# Patient Record
Sex: Male | Born: 1985 | Race: Black or African American | Hispanic: No | Smoking: Never smoker
Health system: Southern US, Community
[De-identification: ages and names within clinical notes are randomized; demographics above are authoritative.]

## PROBLEM LIST (undated history)

## (undated) DIAGNOSIS — J45909 Unspecified asthma, uncomplicated: Secondary | ICD-10-CM

---

## 2016-05-29 ENCOUNTER — Encounter (HOSPITAL_COMMUNITY): Payer: Self-pay | Admitting: Emergency Medicine

## 2016-05-29 ENCOUNTER — Emergency Department (HOSPITAL_COMMUNITY): Payer: Managed Care, Other (non HMO)

## 2016-05-29 ENCOUNTER — Emergency Department (HOSPITAL_COMMUNITY)
Admission: EM | Admit: 2016-05-29 | Discharge: 2016-05-30 | Disposition: A | Discharge status: Home / Self Care | Payer: Managed Care, Other (non HMO) | Attending: Emergency Medicine | Admitting: Emergency Medicine

## 2016-05-29 DIAGNOSIS — J45909 Unspecified asthma, uncomplicated: Secondary | ICD-10-CM | POA: Diagnosis not present

## 2016-05-29 DIAGNOSIS — I319 Disease of pericardium, unspecified: Secondary | ICD-10-CM | POA: Diagnosis not present

## 2016-05-29 DIAGNOSIS — R079 Chest pain, unspecified: Secondary | ICD-10-CM | POA: Diagnosis present

## 2016-05-29 HISTORY — DX: Unspecified asthma, uncomplicated: J45.909

## 2016-05-29 LAB — I-STAT TROPONIN, ED: TROPONIN I, POC: 0.01 ng/mL (ref 0.00–0.08)

## 2016-05-29 LAB — BASIC METABOLIC PANEL
ANION GAP: 11 (ref 5–15)
BUN: 8 mg/dL (ref 6–20)
CALCIUM: 9.4 mg/dL (ref 8.9–10.3)
CHLORIDE: 102 mmol/L (ref 101–111)
CO2: 25 mmol/L (ref 22–32)
CREATININE: 1.3 mg/dL — AB (ref 0.61–1.24)
GFR calc non Af Amer: >60 mL/min (ref >60)
Glucose, Bld: 99 mg/dL (ref 65–99)
Potassium: 4 mmol/L (ref 3.5–5.1)
SODIUM: 138 mmol/L (ref 135–145)

## 2016-05-29 LAB — CBC
HCT: 44.4 % (ref 39.0–52.0)
HEMOGLOBIN: 15.6 g/dL (ref 13.0–17.0)
MCH: 29.7 pg (ref 26.0–34.0)
MCHC: 35.1 g/dL (ref 30.0–36.0)
MCV: 84.4 fL (ref 78.0–100.0)
PLATELETS: 225 10*3/uL (ref 150–400)
RBC: 5.26 MIL/uL (ref 4.22–5.81)
RDW: 12.7 % (ref 11.5–15.5)
WBC: 11.2 10*3/uL — AB (ref 4.0–10.5)

## 2016-05-29 NOTE — ED Triage Notes (Signed)
Pt. reports intermittent left chest pain radiating to left arm with mild SOB , occasional productive cough , no emesis or diaphoresis .

## 2016-05-30 MED ORDER — COLCHICINE 0.6 MG PO TABS: 0.6000 mg | ORAL_TABLET | Freq: Two times a day (BID) | ORAL | 3 refills | Status: DC

## 2016-05-30 MED ORDER — IBUPROFEN 800 MG PO TABS: 800.0000 mg | ORAL_TABLET | Freq: Three times a day (TID) | ORAL | 0 refills | Status: DC

## 2016-05-30 MED ORDER — OMEPRAZOLE 20 MG PO CPDR: 20.0000 mg | DELAYED_RELEASE_CAPSULE | Freq: Every day | ORAL | 0 refills | Status: DC

## 2016-05-30 NOTE — ED Provider Notes (Signed)
[facility]-EMERGENCY DEPT Provider Note   CSN: [id number] Arrival date & time: 05/29/16  2023     History   Chief Complaint Chief Complaint  Patient presents with  . Chest Pain    HPI James Bowers is a 30 y.o. male.  Patient presents to the emergency department with chief complaint of left-sided chest pain. He states that he has been having symptoms since Tuesday. He states that his symptoms are worse when lying down and better when he sits up. He reports mild shortness of breath when lying down. He reports having a recent URI.  He denies any fevers or chills.  There are no other associated symptoms. He denies any cough or wheezing.   The history is provided by the patient. No language interpreter was used.    Past Medical History:  Diagnosis Date  . Asthma     There are no active problems to display for this patient.   History reviewed. No pertinent surgical history.     Home Medications    Prior to Admission medications   Not on File    Family History No family history on file.  Social History Social History  Substance Use Topics  . Smoking status: Never Smoker  . Smokeless tobacco: Never Used  . Alcohol use Yes     Allergies   Azithromycin   Review of Systems Review of Systems  Respiratory: Positive for shortness of breath. Negative for cough and wheezing.   Cardiovascular: Positive for chest pain.  All other systems reviewed and are negative.    Physical Exam Updated Vital Signs There were no vitals taken for this visit.  Physical Exam  Constitutional: He is oriented to person, place, and time. He appears well-developed and well-nourished.  HENT:  Head: Normocephalic and atraumatic.  Eyes: Conjunctivae and EOM are normal. Pupils are equal, round, and reactive to light. Right eye exhibits no discharge. Left eye exhibits no discharge. No scleral icterus.  Neck: Normal range of motion. Neck supple. No JVD present.  Cardiovascular: Normal rate,  regular rhythm and normal heart sounds.  Exam reveals no gallop and no friction rub.   No murmur heard. Pulmonary/Chest: Effort normal and breath sounds normal. No respiratory distress. He has no wheezes. He has no rales. He exhibits no tenderness.  Abdominal: Soft. He exhibits no distension and no mass. There is no tenderness. There is no rebound and no guarding.  Musculoskeletal: Normal range of motion. He exhibits no edema or tenderness.  Neurological: He is alert and oriented to person, place, and time.  Skin: Skin is warm and dry.  Psychiatric: He has a normal mood and affect. His behavior is normal. Judgment and thought content normal.  Nursing note and vitals reviewed.    ED Treatments / Results  Labs (all labs ordered are listed, but only abnormal results are displayed) Labs Reviewed  BASIC METABOLIC PANEL - Abnormal; Notable for the following:       Result Value   Creatinine, Ser 1.30 (*)    All other components within normal limits  CBC - Abnormal; Notable for the following:    WBC 11.2 (*)    All other components within normal limits  I-STAT TROPOININ, ED    EKG  EKG Interpretation  Date/Time:  Friday May 30 2016 00:37:15 EDT Ventricular Rate:  70 PR Interval:  144 QRS Duration: 82 QT Interval:  364 QTC Calculation: 393 R Axis:   27 Text Interpretation:  Sinus rhythm ST-t wave abnormality No significant change  since last tracing Abnormal ekg Confirmed by Gerhard MunchLOCKWOOD, Torrie Namba  MD [phone]) on 05/30/2016 12:43:32 AM       Radiology Dg Chest 2 View  Result Date: 05/29/2016 CLINICAL DATA:  Chest pain and shortness of breath for days. EXAM: CHEST  2 VIEW COMPARISON:  None. FINDINGS: The cardiomediastinal contours are normal. The lungs are clear. Pulmonary vasculature is normal. No consolidation, pleural effusion, or pneumothorax. No acute osseous abnormalities are seen. IMPRESSION: No active cardiopulmonary disease. Electronically Signed   By: Rubye OaksMelanie  Ehinger M.D.    On: 05/29/2016 21:28    Procedures Procedures (including critical care time)  Medications Ordered in ED Medications - No data to display   Initial Impression / Assessment and Plan / ED Course  I have reviewed the triage vital signs and the nursing notes.  Pertinent labs & imaging results that were available during my care of the patient were reviewed by me and considered in my medical decision making (see chart for details).  Clinical Course    Patient with left-sided chest pain since Tuesday.  He states that he has increased pain and some shortness of breath when he lies down, this is improved when he sits up. He reports having a recent URI a couple weeks ago. He denies any fevers or chills. Denies any cough. He states the symptoms have been improving from yesterday.  EKG shows some nonspecific ST changes, this could be significant for pericarditis given the patient's symptoms. I will place patient on anti-inflammatory, colchicine, and will give omeprazole for GI prophylaxis. And close follow-up with primary care doctor.  Troponin is negative. I doubt ACS or myocarditis. Patient is very well-appearing.  Patient discussed with Dr. Bebe ShaggyWickline, who agrees with the plan.  Final Clinical Impressions(s) / ED Diagnoses   Final diagnoses:  Pericarditis, unspecified chronicity, unspecified type    New Prescriptions New Prescriptions   COLCHICINE 0.6 MG TABLET    Take 1 tablet (0.6 mg total) by mouth 2 (two) times daily.   IBUPROFEN (ADVIL,MOTRIN) 800 MG TABLET    Take 1 tablet (800 mg total) by mouth 3 (three) times daily.   OMEPRAZOLE (PRILOSEC) 20 MG CAPSULE    Take 1 capsule (20 mg total) by mouth daily.     Roxy Horsemanobert Marilene Vath, PA-C 05/30/16 0125    Zadie Rhineonald Wickline, MD 05/30/16 [phone]

## 2017-05-06 IMAGING — DX DG CHEST 2V
2 series · 2 of 2 positions shown · non-contrast
Comparison: None.

CLINICAL DATA: Chest pain and shortness of breath for days.

EXAM:
CHEST  2 VIEW

[w chest pa]
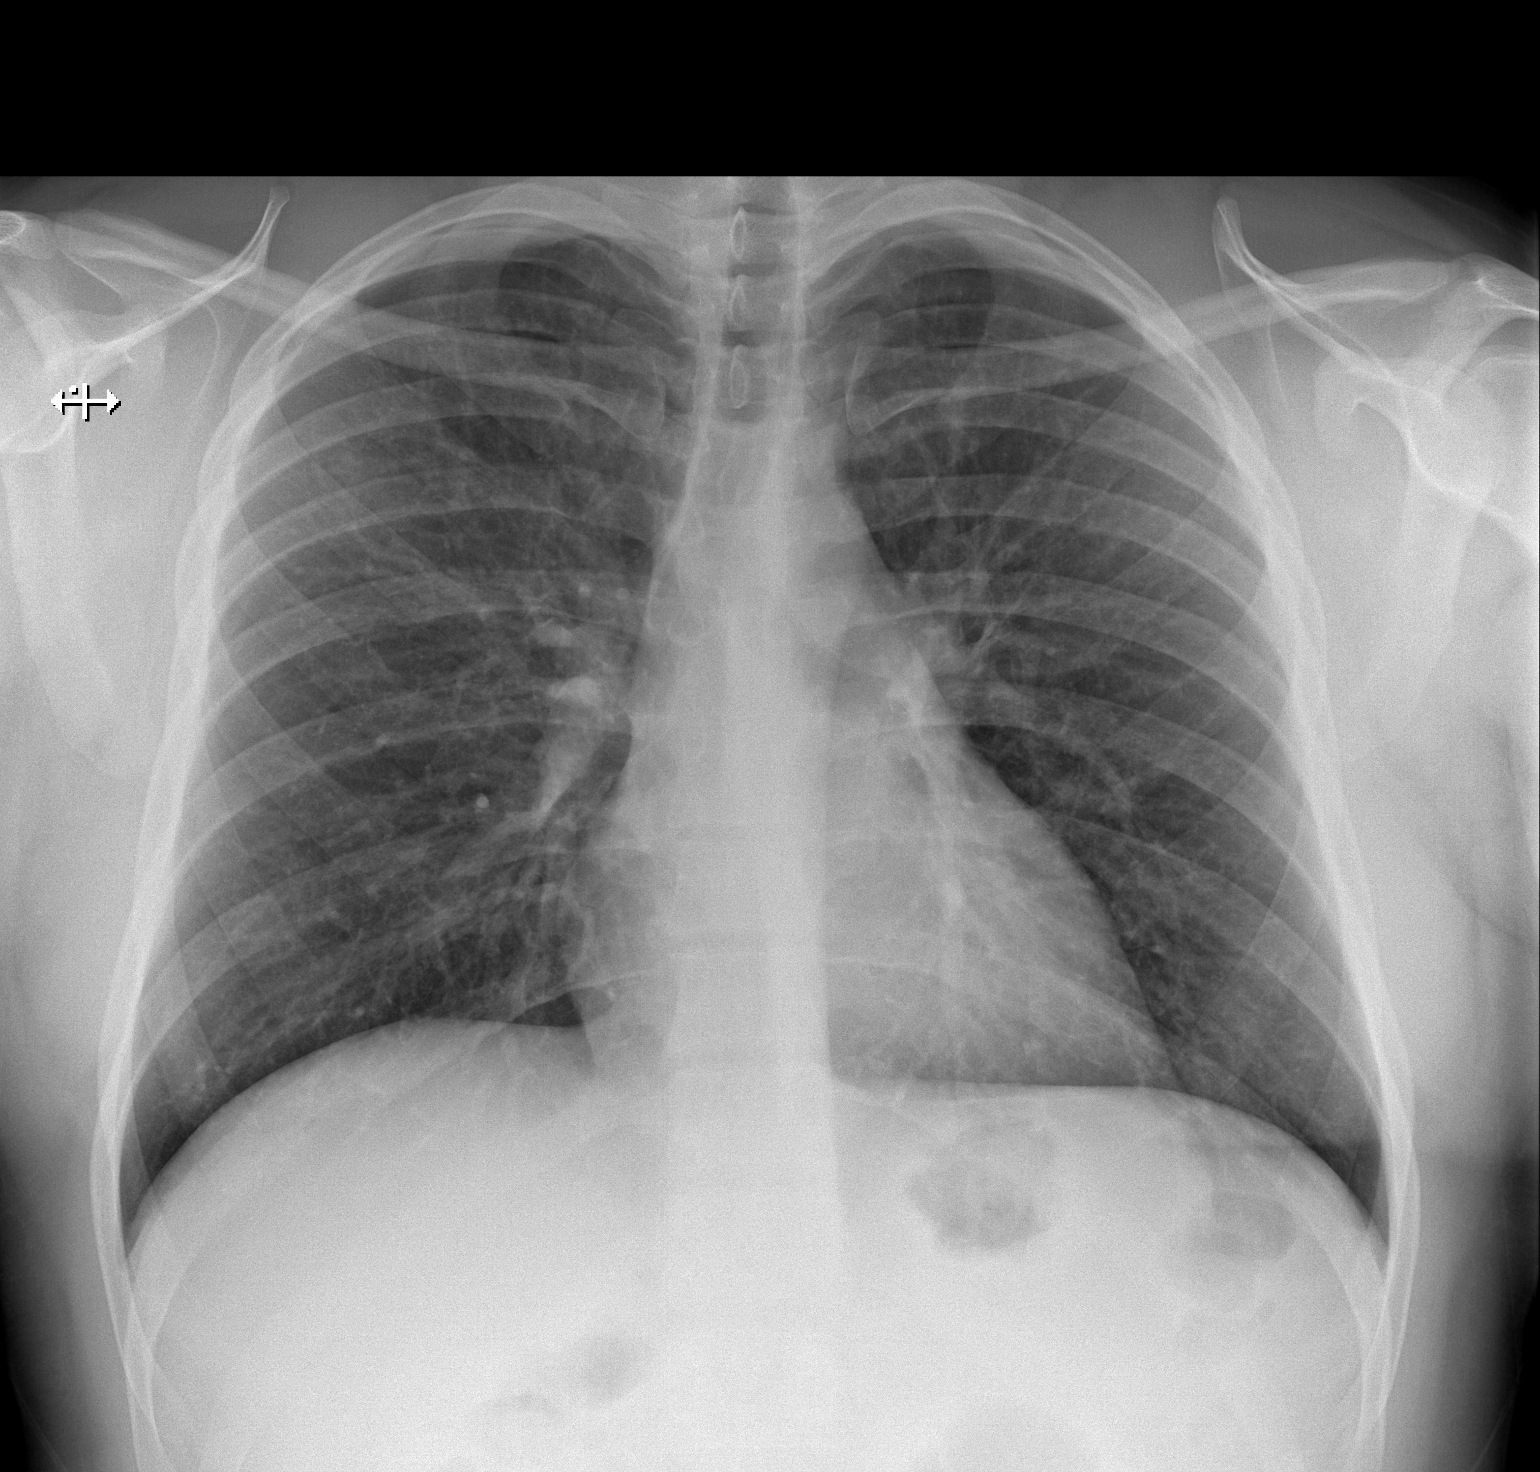

[w chest lat]
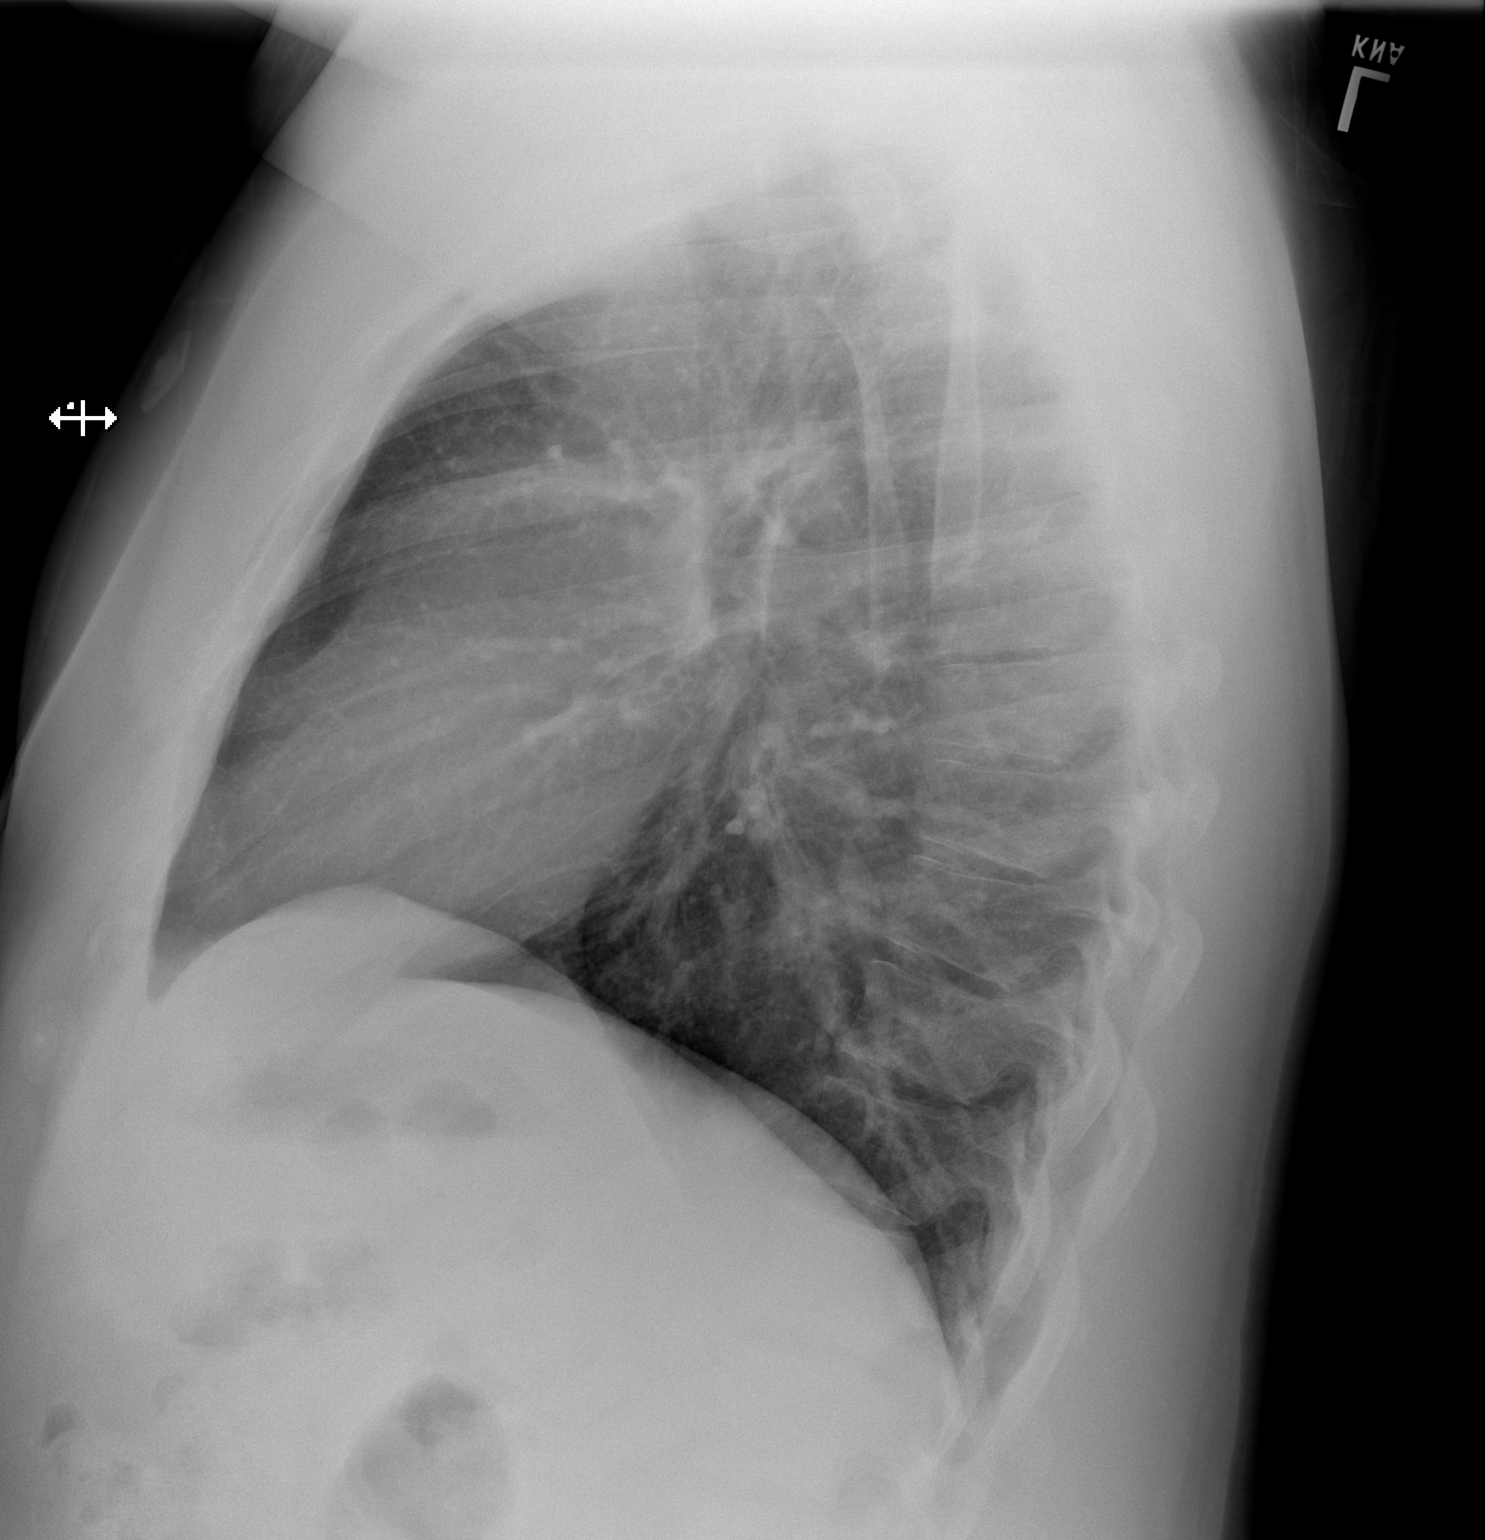

[2 of 2 positions shown; findings below may reference images not displayed]

FINDINGS: The cardiomediastinal contours are normal. The lungs are clear.
Pulmonary vasculature is normal. No consolidation, pleural effusion,
or pneumothorax. No acute osseous abnormalities are seen.
IMPRESSION: No active cardiopulmonary disease.

## 2017-06-01 ENCOUNTER — Encounter: Payer: Self-pay | Admitting: "Endocrinology

## 2017-06-01 ENCOUNTER — Ambulatory Visit (INDEPENDENT_AMBULATORY_CARE_PROVIDER_SITE_OTHER): Payer: Managed Care, Other (non HMO) | Admitting: "Endocrinology

## 2017-06-01 VITALS — BP 136/84 | HR 84 | Ht 74.0 in | Wt 276.0 lb

## 2017-06-01 DIAGNOSIS — E212 Other hyperparathyroidism: Secondary | ICD-10-CM | POA: Diagnosis not present

## 2017-06-01 MED ORDER — VITAMIN D (ERGOCALCIFEROL) 1.25 MG (50000 UNIT) PO CAPS: 50000.0000 [IU] | ORAL_CAPSULE | ORAL | 0 refills | Status: AC

## 2017-06-01 NOTE — Progress Notes (Signed)
Subjective:    Patient ID: James Bowers, male    DOB: 20-Jan-1986, PCP Jaff, Jenean Lindau, MD   Past Medical History:  Diagnosis Date  . Asthma    History reviewed. No pertinent surgical history. Social History   Social History  . Marital status: Married    Spouse name: N/A  . Number of children: N/A  . Years of education: N/A   Social History Main Topics  . Smoking status: Never Smoker  . Smokeless tobacco: Never Used  . Alcohol use Yes  . Drug use: No  . Sexual activity: Not Asked   Other Topics Concern  . None   Social History Narrative  . None   Outpatient Encounter Prescriptions as of 06/01/2017  Medication Sig  . albuterol (PROVENTIL HFA;VENTOLIN HFA) 108 (90 Base) MCG/ACT inhaler Inhale into the lungs every 6 (six) hours as needed.  . Cholecalciferol (VITAMIN D3) 5000 units TABS Take by mouth daily.  Marland Kitchen omalizumab (XOLAIR) 150 MG injection Inject into the skin every 14 (fourteen) days.  Marland Kitchen UNABLE TO FIND Med Name: Allergy shot weekly  . Vitamin D, Ergocalciferol, (DRISDOL) 50000 units CAPS capsule Take 1 capsule (50,000 Units total) by mouth 2 (two) times a week.  . [DISCONTINUED] colchicine 0.6 MG tablet Take 1 tablet (0.6 mg total) by mouth 2 (two) times daily.  . [DISCONTINUED] ibuprofen (ADVIL,MOTRIN) 800 MG tablet Take 1 tablet (800 mg total) by mouth 3 (three) times daily.  . [DISCONTINUED] omeprazole (PRILOSEC) 20 MG capsule Take 1 capsule (20 mg total) by mouth daily.   No facility-administered encounter medications on file as of 06/01/2017.    ALLERGIES: Allergies  Allergen Reactions  . Azithromycin     VACCINATION STATUS:  There is no immunization history on file for this patient.  HPI James Bowers is 31 y.o. male who presents today with a medical history as above. he is being seen in consultation for Elevated PTH requested by Homero Fellers, MD.  - He reports that he was found to have hypocalcemia several months ago associated with  vitamin D  deficiency. He was initiated on vitamin D 3 5000 units daily. On repeat labs on 05/25/2017 he was found to have PTH 104 calcium 8.9. He denies any history of hypercalcemia. - He denies any prior history of parathyroid/thyroid dysfunction. He denies any family history of parathyroid/pituitary/thyroid dysfunction. 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  He denies any history of malabsorption. He is a former Land. - His consumption of dairy products is that of average.   Review of Systems  Constitutional: + Heavy weight most of his adult life, no fatigue, no subjective hyperthermia, no subjective hypothermia Eyes: no blurry vision, no xerophthalmia ENT: no sore throat, no nodules palpated in throat, no dysphagia/odynophagia, no hoarseness Cardiovascular: no Chest Pain, no Shortness of Breath, no palpitations, no leg swelling Respiratory: no cough, no SOB Gastrointestinal: no Nausea/Vomiting/Diarhhea Musculoskeletal: no muscle/joint aches Skin: no rashes Neurological: no tremors, no numbness, no tingling, no dizziness Psychiatric: no depression, no anxiety  Objective:    BP 136/84   Pulse 84   Ht  (1.88 m)   Wt 276 lb (125.2 kg)   BMI 35.44 kg/m   Wt Readings from Last 3 Encounters:  06/01/17 276 lb (125.2 kg)    Physical Exam  Constitutional: + Obese for height, not in acute distress, normal state of mind Eyes: PERRLA, EOMI, no exophthalmos ENT: moist mucous membranes, no thyromegaly,  no cervical lymphadenopathy Cardiovascular: normal precordial activity, Regular Rate and Rhythm, no Murmur/Rubs/Gallops Respiratory:  adequate breathing efforts, no gross chest deformity, Clear to auscultation bilaterally Gastrointestinal: abdomen soft, Non -tender, No distension, Bowel Sounds present Musculoskeletal: no gross deformities, strength intact in all four extremities Skin: moist, warm, no rashes Neurological: no tremor with outstretched hands, Deep  tendon reflexes normal in all four extremities.  CMP ( most recent) CMP     Component Value Date/Time   NA 138 05/29/2016 2036   K 4.0 05/29/2016 2036   CL 102 05/29/2016 2036   CO2 25 05/29/2016 2036   GLUCOSE 99 05/29/2016 2036   BUN 8 05/29/2016 2036   CREATININE 1.30 (H) 05/29/2016 2036   CALCIUM 9.4 05/29/2016 2036   GFRNONAA >60 05/29/2016 2036   GFRAA >60 05/29/2016 2036   05/25/2017: PTH 104, calcium 8.9    Assessment & Plan:   1. Other hyperparathyroidism (HCC)  - James Bowers  is being seen at a kind request of Jaff, Jenean Lindau, MD. - I have reviewed his available parathyroid records and clinically evaluated the patient. 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  - Before initiating workup for primary hyperparathyroidism, I discussed with him the need for correction of vitamin D and repeat labs. - To achieve that, he will need larger dose of vitamin D supplements. I have discussed and initiated vitamin D 2 50,000 units twice a week along with 5000 units of the 3 daily. - He'll return in 3 months with repeat PTH/calcium, thyroid function test, magnesium, phosphorus deficit.  - I advised patient to maintain close follow up with Jaff, Jenean Lindau, MD for primary care needs. Follow up plan: Return in about 3 months (around 09/01/2017) for follow up with pre-visit labs.  Marquis Lunch, MD [facility] Phone: [phone]  Fax: [phone]   06/01/2017, 1:19 PM This note was partially dictated with voice recognition software. Similar sounding words can be transcribed inadequately or may not  be corrected upon review.

## 2017-09-04 ENCOUNTER — Ambulatory Visit: Payer: Managed Care, Other (non HMO) | Admitting: "Endocrinology

## 2017-09-04 ENCOUNTER — Encounter: Payer: Self-pay | Admitting: "Endocrinology

## 2022-10-27 ENCOUNTER — Encounter: Payer: Self-pay | Admitting: Internal Medicine

## 2022-10-27 ENCOUNTER — Other Ambulatory Visit: Payer: Self-pay

## 2022-10-27 ENCOUNTER — Ambulatory Visit (INDEPENDENT_AMBULATORY_CARE_PROVIDER_SITE_OTHER): Payer: No Typology Code available for payment source | Admitting: Internal Medicine

## 2022-10-27 VITALS — BP 130/70 | HR 88 | Temp 98.0°F | Resp 20 | Ht 75.0 in | Wt 274.0 lb

## 2022-10-27 DIAGNOSIS — L2089 Other atopic dermatitis: Secondary | ICD-10-CM | POA: Diagnosis not present

## 2022-10-27 DIAGNOSIS — J3089 Other allergic rhinitis: Secondary | ICD-10-CM | POA: Diagnosis not present

## 2022-10-27 DIAGNOSIS — J4520 Mild intermittent asthma, uncomplicated: Secondary | ICD-10-CM

## 2022-10-27 DIAGNOSIS — J302 Other seasonal allergic rhinitis: Secondary | ICD-10-CM

## 2022-10-27 MED ORDER — FLUTICASONE PROPIONATE 50 MCG/ACT NA SUSP: 2.0000 | Freq: Every day | NASAL | 5 refills | Status: DC

## 2022-10-27 MED ORDER — OLOPATADINE HCL 0.2 % OP SOLN: 1.0000 [drp] | Freq: Every day | OPHTHALMIC | 5 refills | Status: DC | PRN

## 2022-10-27 MED ORDER — TACROLIMUS 0.1 % EX OINT: TOPICAL_OINTMENT | CUTANEOUS | 5 refills | Status: DC

## 2022-10-27 MED ORDER — AZELASTINE HCL 0.1 % NA SOLN: 1.0000 | Freq: Two times a day (BID) | NASAL | 5 refills | Status: DC

## 2022-10-27 MED ORDER — EPINEPHRINE 0.3 MG/0.3ML IJ SOAJ: INTRAMUSCULAR | 2 refills | Status: DC

## 2022-10-27 MED ORDER — TRIAMCINOLONE ACETONIDE 0.1 % EX OINT: TOPICAL_OINTMENT | CUTANEOUS | 5 refills | Status: DC

## 2022-10-27 MED ORDER — CETIRIZINE HCL 10 MG PO TABS: 10.0000 mg | ORAL_TABLET | Freq: Every day | ORAL | 5 refills | Status: DC

## 2022-10-27 MED ORDER — HYDROCORTISONE 2.5 % EX CREA: TOPICAL_CREAM | Freq: Two times a day (BID) | CUTANEOUS | 5 refills | Status: DC

## 2022-10-27 NOTE — Patient Instructions (Addendum)
Allergic Rhinitis: - Positive skin test 10/2022: trees, grasses, weeds, molds, dust mite, cat, cockroach - Avoidance measures discussed. - Use nasal saline rinses before nose sprays such as with Neilmed Sinus Rinse.  Use distilled water.   - Use Flonase 2 sprays each nostril daily. Aim upward and outward. - Use Azelastine 1-2 sprays each nostril twice daily as needed. Aim upward and outward. - Use Zyrtec 10 mg daily.  - For eyes, use Olopatadine or Ketotifen 1 eye drop daily as needed for itchy, watery eyes.  Available over the counter, if not covered by insurance.  - Consider allergy shots as long term control of your symptoms by teaching your immune system to be more tolerant of your allergy triggers.  Consent forms signed today. Bring Epipen for each shot visit.  Make sure to stay on schedule with once a week shot. I will place the orders to mix the vial.   Asthma: - MDI technique discussed.   - Maintenance inhaler: none - Rescue inhaler: Albuterol 2 puffs via spacer or 1 vial via nebulizer every 4-6 hours as needed for respiratory symptoms of cough, shortness of breath, or wheezing Asthma control goals:  Full participation in all desired activities (may need albuterol before activity) Albuterol use two times or less a week on average (not counting use with activity) Cough interfering with sleep two times or less a month Oral steroids no more than once a year No hospitalizations   Eczema: - Do a daily soaking tub bath in warm water for 10-15 minutes.  - Use a gentle, unscented cleanser at the end of the bath (such as Dove unscented bar or baby wash, or Aveeno sensitive body wash). Then rinse, pat half-way dry, and apply a gentle, unscented moisturizer cream or ointment (Cerave, Cetaphil, Eucerin, Aveeno)  all over while still damp. Dry skin makes the itching and rash of eczema worse. The skin should be moisturized with a gentle, unscented moisturizer at least twice daily.  - Use only  unscented liquid laundry detergent. - Apply prescribed topical steroid (triamcinolone 0.1% below neck or hydrocortisone 2.5% above neck) to flared areas (red and thickened eczema) after the moisturizer has soaked into the skin (wait at least 30 minutes). Taper off the topical steroids as the skin improves. Do not use topical steroid for more than 7-10 days at a time.  - Put Protopic onto areas of rough eczema (that is not red) twice a day. May decrease to once a day as the eczema improves. This will not thin the skin, and is safe for chronic use. Do not put this onto normal appearing skin. Okay to use on face.    ALLERGEN AVOIDANCE MEASURES   Dust Mites Use central air conditioning and heat; and change the filter monthly.  Pleated filters work better than mesh filters.  Electrostatic filters may also be used; wash the filter monthly.  Window air conditioners may be used, but do not clean the air as well as a central air conditioner.  Change or wash the filter monthly. Keep windows closed.  Do not use attic fans.   Encase the mattress, box springs and pillows with zippered, dust proof covers. Wash the bed linens in hot water weekly.   Remove carpet, especially from the bedroom. Remove stuffed animals, throw pillows, dust ruffles, heavy drapes and other items that collect dust from the bedroom. Do not use a humidifier.   Use wood, vinyl or leather furniture instead of cloth furniture in the bedroom. Keep the  indoor humidity at 30 - 40%.  Monitor with a humidity gauge.  Molds - Indoor avoidance Use air conditioning to reduce indoor humidity.  Do not use a humidifier. Keep indoor humidity at 30 - 40%.  Use a dehumidifier if needed. In the bathroom use an exhaust fan or open a window after showering.  Wipe down damp surfaces after showering.  Clean bathrooms with a mold-killing solution (diluted bleach, or products like Tilex, etc) at least once a month. In the kitchen use an exhaust fan to remove  steam from cooking.  Throw away spoiled foods immediately, and empty garbage daily.  Empty water pans below self-defrosting refrigerators frequently. Vent the clothes dryer to the outside. Limit indoor houseplants; mold grows in the dirt.  No houseplants in the bedroom. Remove carpet from the bedroom. Encase the mattress and box springs with a zippered encasing.  Molds - Outdoor avoidance Avoid being outside when the grass is being mowed, or the ground is tilled. Avoid playing in leaves, pine straw, hay, etc.  Dead plant materials contain mold. Avoid going into barns or grain storage areas. Remove leaves, clippings and compost from around the home.  Cockroach Limit spread of food around the house; especially keep food out of bedrooms. Keep food and garbage in closed containers with a tight lid.  Never leave food out in the kitchen.  Do not leave out pet food or dirty food bowls. Mop the kitchen floor and wash countertops at least once a week. Repair leaky pipes and faucets so there is no standing water to attract roaches. Plug up cracks in the house through which cockroaches can enter. Use bait stations and approved pesticides to reduce cockroach infestation. Pollen Avoidance Pollen levels are highest during the mid-day and afternoon.  Consider this when planning outdoor activities. Avoid being outside when the grass is being mowed, or wear a mask if the pollen-allergic person must be the one to mow the grass. Keep the windows closed to keep pollen outside of the home. Use an air conditioner to filter the air. Take a shower, wash hair, and change clothing after working or playing outdoors during pollen season. Pet Dander Keep the pet out of your bedroom and restrict it to only a few rooms. Be advised that keeping the pet in only one room will not limit the allergens to that room. Don't pet, hug or kiss the pet; if you do, wash your hands with soap and water. High-efficiency particulate  air (HEPA) cleaners run continuously in a bedroom or living room can reduce allergen levels over time. Regular use of a high-efficiency vacuum cleaner or a central vacuum can reduce allergen levels. Giving your pet a bath at least once a week can reduce airborne allergen.

## 2022-10-27 NOTE — Progress Notes (Signed)
NEW PATIENT  Date of Service/Encounter:  10/27/22  Consult requested by: Robley Fries, MD   Subjective:   James Bowers (DOB: 1986-03-04) is a 37 y.o. male who presents to the clinic on 10/27/2022 with a chief complaint of Establish Care (Pt states he had testing for environmental allergies 2 years ago to environmental allergies and use to get shots he has no known food allergies.) .    History obtained from: chart review and patient.  Asthma:  Diagnosed at age 54-9.  Rarely has SOB/wheezing and mostly when he gets really sick.   rare daytime symptoms in past month, 0 nighttime awakenings in past month Using rescue inhaler: less than once a month Limitations to daily activity: none 0 ED visits/UC visits and 0 oral steroids in the past year 0 number of lifetime hospitalizations, 0 number of lifetime intubations.  Identified Triggers: none  Prior PFTs or spirometry:  09/23/2022: normal spirometry, no obstruction, no restriction, normal gas transfer   Previously used therapies: Advair when he was young.  Current regimen:  Maintenance: none Rescue: Albuterol 2 puffs q4-6 hrs PRN  Rhinitis:  Started around in childhood.  Symptoms include: nasal congestion, rhinorrhea, post nasal drainage, watery eyes, and itchy eyes  Occurs year-round but worse in Spring/Summer  Potential triggers: not sure Treatments tried:  Flonase  Zyrtec PRN; last use was last Monday  Previously on AIT with  Dr Elenore Rota.  Did this for 2 years, never reached red vial. Denies any systemic reactions, rarely had slight swelling/itching at site of injection. Had an Epipen with him for the shots.   Previous allergy testing: yes 2 years ago but can't recall exact results History of reflux/heartburn: yes pepcid '20mg'$  daily; it does help  History of sinus surgery: no Nonallergic triggers: none   Atopic Dermatitis:  Diagnosed since childhood.  Areas that flare commonly are antecubital fossa, forehead,  neck. Previous therapies tried: AIT Current regimen: triamcinolone PRN, tacrolimus PRN, aquaphor  Reports use of fragrance/dye free products Sleep is not affected  Past Medical History: Past Medical History:  Diagnosis Date   Asthma     Past Surgical History: History reviewed. No pertinent surgical history.  Family History: History reviewed. No pertinent family history.  Social History:  Lives in a 30 year house Flooring in bedroom: wood Pets: dog Tobacco use/exposure: none Job: truck driver  Medication List:  Allergies as of 10/27/2022       Reactions   Azithromycin         Medication List        Accurate as of October 27, 2022 12:17 PM. If you have any questions, ask your nurse or doctor.          albuterol 108 (90 Base) MCG/ACT inhaler Commonly known as: VENTOLIN HFA Inhale into the lungs every 6 (six) hours as needed.   omalizumab 150 MG injection Commonly known as: XOLAIR Inject into the skin every 14 (fourteen) days.   UNABLE TO FIND Med Name: Allergy shot weekly   Vitamin D (Ergocalciferol) 1.25 MG (50000 UNIT) Caps capsule Commonly known as: DRISDOL Take 1 capsule (50,000 Units total) by mouth 2 (two) times a week.   Vitamin D3 125 MCG (5000 UT) Tabs Generic drug: Cholecalciferol Take by mouth daily.         REVIEW OF SYSTEMS: Pertinent positives and negatives discussed in HPI.   Objective:   Physical Exam: BP 130/70   Pulse 88   Temp 98 F (36.7 C)  Resp 20   Ht '6\' 3"'$  (1.905 m)   Wt 274 lb (124.3 kg)   SpO2 96%   BMI 34.25 kg/m  Body mass index is 34.25 kg/m. GEN: alert, well developed HEENT: clear conjunctiva, TM grey and translucent, nose with + inferior turbinate hypertrophy, pale nasal mucosa, slight clear rhinorrhea, + cobblestoning HEART: regular rate and rhythm, no murmur LUNGS: clear to auscultation bilaterally, no coughing, unlabored respiration ABDOMEN: soft, non distended  SKIN: hyperpigmented patches on bl  antecubital fossa, diffuse dry skin  Reviewed:  [facility]/20[facility]: [facility] with FENO 15ppb and PFT with no obstruction, no restriction, normal gas transfer.  Done by Dr. Janina Mayo.    09/15/2022: seen by Dr. Abram Sander [facility] at [facility].  Referred to [place] for AIT. Also with eczema and asthma. On Albuterol, Zyrtec, Flonase, triamcinolone, Vanicream. Positive blood test to grasses, mold, trees. IgE 114.   04/12/2018: seen by Dr. Jodean Lima for other asthma, controlled on PRN albuterol.   Skin Testing:  Skin prick testing was placed, which includes aeroallergens/foods, histamine control, and saline control.  Verbal consent was obtained prior to placing test.  Patient tolerated procedure well.  Allergy testing results were read and interpreted by myself, documented by clinical staff. Adequate positive and negative control.  Results discussed with patient/family.  Airborne Adult Perc - 10/27/22 0932     Time Antigen Placed 0932    Allergen Manufacturer Lavella Hammock    Location Back    Number of Test 59    Panel 1 Select    1. Control-Buffer 50% Glycerol Negative    2. Control-Histamine 1 mg/ml 3+    3. Albumin saline Negative    4. [place] 3+    5. [place] 3+    6. [place] 3+    7. [place] 3+    8. Meadow Fescue 3+    9. Perennial Rye 3+    10. Sweet Vernal 3+    11. Timothy 3+    12. Cocklebur 2+    13. Burweed Marshelder 2+    14. Ragweed, short Negative    15. Ragweed, Giant Negative    16. Plantain,  English 3+    17. [place] 3+    18. Sheep Sorrell 2+    19. Rough Pigweed 2+    20. Marsh Elder, Rough 2+    21. Mugwort, Common 3+    22. Ash mix 3+    23. Birch mix 3+    24. Beech American 3+    25. Box, Elder 2+    26. Cedar, red 3+    27. Cottonwood, Eastern 3+    28. Elm mix 3+    29. Hickory 3+    30. Maple mix 3+    31. Oak, [place] mix 3+    32. Pecan Pollen 3+    33. Pine mix 3+    34. Sycamore Eastern 3+    35. [place], Black Pollen 3+    36. Alternaria alternata 3+     37. Cladosporium Herbarum 3+    38. Aspergillus mix Negative    39. Penicillium mix Negative    40. Bipolaris sorokiniana (Helminthosporium) 3+    41. Drechslera spicifera (Curvularia) 3+    42. Mucor plumbeus Negative    43. Fusarium moniliforme 3+    44. Aureobasidium pullulans (pullulara) Negative    45. Rhizopus oryzae 3+    46. Botrytis cinera 3+    47. Epicoccum nigrum 3+    48. Phoma betae 2+  49. Candida Albicans 3+    50. Trichophyton mentagrophytes 3+    51. Mite, D Farinae  5,000 AU/ml 3+    52. Mite, D Pteronyssinus  5,000 AU/ml 3+    53. Cat Hair 10,000 BAU/ml Negative    54.  Dog Epithelia Negative    55. Mixed Feathers Negative    56. Horse Epithelia Negative    57. Cockroach, German 3+    58. Mouse Negative    59. Tobacco Leaf Negative             Intradermal - 10/27/22 1015     Time Antigen Placed 1015    Allergen Manufacturer Lavella Hammock    Location Arm    Number of Test 4    Intradermal Select    Control Negative    Ragweed mix 3+    Cat 3+    Dog Negative               Assessment:   1. Mild intermittent asthma without complication   2. Flexural atopic dermatitis   3. Seasonal and perennial allergic rhinitis     Plan/Recommendations:  Allergic Rhinitis: - Due to turbinate hypertrophy, seasonal symptoms and unresponsive to OTC meds (requiring AIT in the past), performed skin testing to identify aeroallergen triggers.  Uncontrolled and wishes to start AIT as it helps with his rhinoconjunctivitis and eczema.  - Positive skin test 10/2022: trees, grasses, weeds, molds, dust mite, cat, cockroach - Avoidance measures discussed. - Use nasal saline rinses before nose sprays such as with Neilmed Sinus Rinse.  Use distilled water.   - Use Flonase 2 sprays each nostril daily. Aim upward and outward. - Use Azelastine 1-2 sprays each nostril twice daily as needed. Aim upward and outward. - Use Zyrtec 10 mg daily.  - For eyes, use Olopatadine or  Ketotifen 1 eye drop daily as needed for itchy, watery eyes.  Available over the counter, if not covered by insurance.  - Consider allergy shots as long term control of your symptoms by teaching your immune system to be more tolerant of your allergy triggers. Consent forms signed today. Bring Epipen for each shot visit.  Make sure to stay on schedule with once a week shot. I will place the orders to mix the vial.   Mild Intermittent Asthma: - Well controlled on PRN Albuterol. MDI technique discussed.  [facility]rom[facility] at [facility] 09/[facility]22 was normal. - Maintenance inhaler: none - Rescue inhaler: Albuterol 2 puffs via spacer or 1 vial via nebulizer every 4-6 hours as needed for respiratory symptoms of cough, shortness of breath, or wheezing Asthma control goals:  Full participation in all desired activities (may need albuterol before activity) Albuterol use two times or less a week on average (not counting use with activity) Cough interfering with sleep two times or less a month Oral steroids no more than once a year No hospitalizations   Eczema: - Do a daily soaking tub bath in warm water for 10-15 minutes.  - Use a gentle, unscented cleanser at the end of the bath (such as Dove unscented bar or baby wash, or Aveeno sensitive body wash). Then rinse, pat half-way dry, and apply a gentle, unscented moisturizer cream or ointment (Cerave, Cetaphil, Eucerin, Aveeno)  all over while still damp. Dry skin makes the itching and rash of eczema worse. The skin should be moisturized with a gentle, unscented moisturizer at least twice daily.  - Use only unscented liquid laundry detergent. - Apply prescribed topical steroid (triamcinolone 0.1%  below neck or hydrocortisone 2.5% above neck) to flared areas (red and thickened eczema) after the moisturizer has soaked into the skin (wait at least 30 minutes). Taper off the topical steroids as the skin improves. Do not use topical steroid for more than 7-10 days at a time.  - Put  Protopic onto areas of rough eczema (that is not red) twice a day. May decrease to once a day as the eczema improves. This will not thin the skin, and is safe for chronic use. Do not put this onto normal appearing skin. Okay to use on face.     Return in about 3 months (around 01/27/2023).  Harlon Flor, MD [facility] [place] [facility] [place]

## 2022-10-28 DIAGNOSIS — J3089 Other allergic rhinitis: Secondary | ICD-10-CM | POA: Diagnosis not present

## 2022-10-28 NOTE — Progress Notes (Signed)
Aeroallergen Immunotherapy  Ordering Provider: Harlon Flor  Patient Details Name: James Bowers MRN: [id number] Date of Birth: 1985/11/21  Order 1 of 2  Vial Label: pollen, DM, CR, cat  0.3 ml (Volume)  BAU Concentration -- 7 Grass Mix* 100,000 ([address] [place], [place], [place], Perennial Rye, RedTop, Sweet Vernal, Timothy) 0.2 ml (Volume)  1:20 Concentration -- [place] 0.3 ml (Volume)  BAU Concentration -- [place] 10,000 0.2 ml (Volume)  1:20 Concentration -- Johnson 0.3 ml (Volume)  1:20 Concentration -- Ragweed Mix 0.2 ml (Volume)  1:20 Concentration -- Burweed Marshelder 0.2 ml (Volume)  1:10 Concentration -- Plantain English 0.5 ml (Volume)  1:20 Concentration -- Weed Mix* 0.5 ml (Volume)  1:20 Concentration -- Eastern 10 Tree Mix (also Sweet Gum) 0.2 ml (Volume)  1:20 Concentration -- Box Elder 0.2 ml (Volume)  1:10 Concentration -- Cedar, red 0.2 ml (Volume)  1:10 Concentration -- Pecan Pollen 0.2 ml (Volume)  1:10 Concentration -- Pine Mix 0.2 ml (Volume)  1:20 Concentration -- Walnut, Black Pollen 0.5 ml (Volume)  1:10 Concentration -- Cat Hair 0.3 ml (Volume)  1:20 Concentration -- Cockroach, German 0.5 ml (Volume)   AU Concentration -- Mite Mix (DF 5,000 & DP 5,000)   5  ml Extract Subtotal 0  ml Diluent 5.0  ml Maintenance Total  Schedule:  B Silver Vial (1:1,000,000): Schedule B (6 doses) Blue Vial (1:100,000): Schedule B (6 doses) Yellow Vial (1:10,000): Schedule B (6 doses) Green Vial (1:1,000): Schedule B (6 doses) Red Vial (1:100): Schedule A (10 doses)  Special Instructions: Bring Epipen

## 2022-10-28 NOTE — Progress Notes (Signed)
VIALS EXP 10-28-23

## 2022-10-28 NOTE — Progress Notes (Signed)
Aeroallergen Immunotherapy   Ordering Provider: Harlon Flor   Patient Details  Name: James Bowers  MRN: [id number]  Date of Birth: [birth year]   Order 2 of 2   Vial Label: molds   0.2 ml (Volume)  1:20 Concentration -- Alternaria alternata  0.2 ml (Volume)  1:20 Concentration -- Cladosporium herbarum  0.2 ml (Volume)  1:10 Concentration -- Aspergillus mix  0.2 ml (Volume)  1:20 Concentration -- Bipolaris sorokiniana  0.2 ml (Volume)  1:20 Concentration -- Drechslera spicifera  0.2 ml (Volume)  1:10 Concentration -- Fusarium moniliforme  0.2 ml (Volume)  1:10 Concentration -- Rhizopus oryzae  0.2 ml (Volume)  1:40 Concentration -- Botrytis cinerea  0.2 ml (Volume)  1:40 Concentration -- Epicoccum nigrum  0.2 ml (Volume)  1:40 Concentration -- Phoma betae    2  ml Extract Subtotal  3  ml Diluent  5.0  ml Maintenance Total   Schedule:  B  Silver Vial (1:1,000,000): Schedule B (6 doses)  Blue Vial (1:100,000): Schedule B (6 doses)  Yellow Vial (1:10,000): Schedule B (6 doses)  Green Vial (1:1,000): Schedule B (6 doses)  Red Vial (1:100): Schedule A (10 doses)   Special Instructions: Bring Epipen

## 2022-10-29 DIAGNOSIS — J302 Other seasonal allergic rhinitis: Secondary | ICD-10-CM | POA: Diagnosis not present

## 2022-11-07 ENCOUNTER — Telehealth: Payer: Self-pay

## 2022-11-07 NOTE — Telephone Encounter (Signed)
Patients pharmacy faxed a forms expressing that olopatadine 0.2% was not covered under his formulary. [facility] would [facility] ketotifen or olopatadine 0.1%. Please advise on which eye drop to send in for patient. Thank you!

## 2022-11-10 ENCOUNTER — Other Ambulatory Visit: Payer: Self-pay | Admitting: Internal Medicine

## 2022-11-10 MED ORDER — KETOTIFEN FUMARATE 0.025 % OP SOLN: 1.0000 [drp] | Freq: Every day | OPHTHALMIC | 5 refills | Status: DC | PRN

## 2022-11-10 NOTE — Telephone Encounter (Signed)
I called patient and informed that the Zaditor eye drops were sent in.

## 2022-11-10 NOTE — Telephone Encounter (Signed)
Thank youuu!

## 2022-11-10 NOTE — Progress Notes (Signed)
Olopatadine not covered but Ketotifen is so sending that in.

## 2022-11-21 ENCOUNTER — Ambulatory Visit (INDEPENDENT_AMBULATORY_CARE_PROVIDER_SITE_OTHER): Payer: No Typology Code available for payment source

## 2022-11-21 DIAGNOSIS — J309 Allergic rhinitis, unspecified: Secondary | ICD-10-CM

## 2022-11-21 NOTE — Progress Notes (Signed)
Immunotherapy   Patient Details  Name: James Bowers MRN: [id number] Date of Birth: [birth year]  11/21/2022  James Bowers started injections for  pollens, dust mites, molds, cockroaches, and cats. Following schedule: B  Frequency:1 time per week Epi-Pen:Epi-Pen Available  Consent signed previously and patient instructions given. Patient waited in the lobby for thirty minutes without an issue.   Ralene Muskrat 11/21/2022, 11:12 AM

## 2022-12-31 ENCOUNTER — Ambulatory Visit (INDEPENDENT_AMBULATORY_CARE_PROVIDER_SITE_OTHER): Payer: No Typology Code available for payment source

## 2022-12-31 DIAGNOSIS — J309 Allergic rhinitis, unspecified: Secondary | ICD-10-CM

## 2023-01-16 ENCOUNTER — Ambulatory Visit (INDEPENDENT_AMBULATORY_CARE_PROVIDER_SITE_OTHER): Payer: No Typology Code available for payment source

## 2023-01-16 DIAGNOSIS — J309 Allergic rhinitis, unspecified: Secondary | ICD-10-CM

## 2023-01-26 ENCOUNTER — Other Ambulatory Visit: Payer: Self-pay

## 2023-01-26 ENCOUNTER — Encounter: Payer: Self-pay | Admitting: Internal Medicine

## 2023-01-26 ENCOUNTER — Ambulatory Visit: Payer: No Typology Code available for payment source | Admitting: Internal Medicine

## 2023-01-26 VITALS — Wt 265.0 lb

## 2023-01-26 DIAGNOSIS — L2089 Other atopic dermatitis: Secondary | ICD-10-CM

## 2023-01-26 DIAGNOSIS — J302 Other seasonal allergic rhinitis: Secondary | ICD-10-CM

## 2023-01-26 DIAGNOSIS — J4520 Mild intermittent asthma, uncomplicated: Secondary | ICD-10-CM

## 2023-01-26 MED ORDER — FLUTICASONE PROPIONATE 50 MCG/ACT NA SUSP: 2.0000 | Freq: Every day | NASAL | 1 refills | Status: AC

## 2023-01-26 MED ORDER — KETOTIFEN FUMARATE 0.035 % OP SOLN: 1.0000 [drp] | Freq: Every day | OPHTHALMIC | 1 refills | Status: AC | PRN

## 2023-01-26 MED ORDER — CETIRIZINE HCL 10 MG PO TABS: 10.0000 mg | ORAL_TABLET | Freq: Every day | ORAL | 1 refills | Status: AC | PRN

## 2023-01-26 MED ORDER — ALBUTEROL SULFATE HFA 108 (90 BASE) MCG/ACT IN AERS: 1.0000 | INHALATION_SPRAY | Freq: Four times a day (QID) | RESPIRATORY_TRACT | 1 refills | Status: AC | PRN

## 2023-01-26 MED ORDER — AZELASTINE HCL 0.1 % NA SOLN: 1.0000 | Freq: Two times a day (BID) | NASAL | 5 refills | Status: AC

## 2023-01-26 MED ORDER — HYDROCORTISONE 2.5 % EX CREA: TOPICAL_CREAM | Freq: Two times a day (BID) | CUTANEOUS | 5 refills | Status: AC

## 2023-01-26 MED ORDER — TRIAMCINOLONE ACETONIDE 0.1 % EX OINT: TOPICAL_OINTMENT | CUTANEOUS | 1 refills | Status: AC

## 2023-01-26 MED ORDER — TACROLIMUS 0.1 % EX OINT: TOPICAL_OINTMENT | CUTANEOUS | 5 refills | Status: AC

## 2023-01-26 NOTE — Progress Notes (Signed)
RE: James Bowers MRN: [id number] DOB: [birth year] Date of Telemedicine Visit: 01/26/2023  Referring provider: Homero Fellers, MD Primary care provider: [facility]  Chief Complaint: Asthma (Says he is fine no issues or concerns at this time. ) and Allergic Rhinitis  (Says not as bad as before. Treatment plan is working. Not as many flare ups as before. )   Telemedicine Follow Up Visit via Telephone: I connected with James Bowers for a follow up on 01/26/23 by telephone and verified that I am speaking with the correct person using two identifiers.   I discussed the limitations, risks, security and privacy concerns of performing an evaluation and management service by telephone and the availability of in person appointments. I also discussed with the patient that there may be a patient responsible charge related to this service. The patient expressed understanding and agreed to proceed.  Patient is at work accompanied by himself who provided/contributed to the history.  Provider is at the office.  Visit start time: 11:07 AM Visit end time: 11:24 AM Insurance consent/check in by: front desk Medical consent and medical assistant/nurse: Lucretia   History of Present Illness:  He is a 37 y.o. male, who is being followed for mild intermittent asthma, eczema and allergic rhinoconjunctivitis. His previous allergy office visit was in 10/27/2022 with myself.   Asthma: Asthma Control Test: ACT Total Score: 25.    Since last visit, he reports asthma has been well controlled.  No albuterol use or ER visits or oral prednisone since then.  Only flares up with an illness usually in Fall/Winter.   Allergies: Has had on and off flare ups with congestion, drainage, runny nose, itchy watery eyes.  He did start AIT but has had some trouble being on weekly dosing due to work schedule but he is going to work on getting back on track and wants to continue.  No issues with the first few shots and has  an Epipen.  Taking Flonase and Zyrtec daily, sometimes Azelastine and Zatidor.   Eczema: Does moisturize with emollient through [place] that he is going to call them about refilling. Requires topical steroids usually triamcinolone about twice a month for 3-4 days. He is not sure if he is using Protopic. Mostly flares up on arms.    Otherwise, there have been no changes to his past medical history, surgical history, family history, or social history.  Assessment and Plan:  Eisa is a 37 y.o. male with:  Mild intermittent asthma without complication  Flexural atopic dermatitis  Seasonal and perennial allergic rhinitis  Allergic Rhinitis: - Positive skin test 10/2022: trees, grasses, weeds, molds, dust mite, cat, cockroach - Avoidance measures discussed. - Use nasal saline rinses before nose sprays such as with Neilmed Sinus Rinse.  Use distilled water.   - Use Flonase 2 sprays each nostril daily. Aim upward and outward. - Use Azelastine 1-2 sprays each nostril twice daily as needed. Aim upward and outward. - Use Zyrtec 10 mg daily.  - For eyes, use Olopatadine or Ketotifen 1 eye drop daily as needed for itchy, watery eyes.  Available over the counter, if not covered by insurance.  - Continue allergy shots.  Bring Epipen at each visit.  Discussed getting back on track with this and coming once weekly to help build up on time.   Asthma: - Maintenance inhaler: none - Rescue inhaler: Albuterol 2 puffs via spacer or 1 vial via nebulizer every 4-6 hours as needed for respiratory symptoms of cough,  shortness of breath, or wheezing Asthma control goals:  Full participation in all desired activities (may need albuterol before activity) Albuterol use two times or less a week on average (not counting use with activity) Cough interfering with sleep two times or less a month Oral steroids no more than once a year No hospitalizations   Eczema: - Do a daily soaking tub bath in warm water for  10-15 minutes.  - Use a gentle, unscented cleanser at the end of the bath (such as Dove unscented bar or baby wash, or Aveeno sensitive body wash). Then rinse, pat half-way dry, and apply a gentle, unscented moisturizer cream or ointment (Cerave, Cetaphil, Eucerin, Aveeno)  all over while still damp. Dry skin makes the itching and rash of eczema worse. The skin should be moisturized with a gentle, unscented moisturizer at least twice daily.  - Use only unscented liquid laundry detergent. - Apply prescribed topical steroid (triamcinolone 0.1% below neck or hydrocortisone 2.5% above neck) to flared areas (red and thickened eczema) after the moisturizer has soaked into the skin (wait at least 30 minutes). Taper off the topical steroids as the skin improves. Do not use topical steroid for more than 7-10 days at a time.  - Put Protopic onto areas of rough eczema (that is not red) twice a day. May decrease to once a day as the eczema improves. This will not thin the skin, and is safe for chronic use. Do not put this onto normal appearing skin. Okay to use on face.     Diagnostics: None.  Medication List:  Current Outpatient Medications  Medication Sig Dispense Refill   Cholecalciferol (VITAMIN D3) 5000 units TABS Take by mouth daily.     emollient cream Apply 1 Application topically 2 (two) times daily.     EPINEPHrine (EPIPEN 2-PAK) 0.3 mg/0.3 mL IJ SOAJ injection Inject 0.3mg  into the muscle once for anaphylaxis 2 each 2   famotidine (PEPCID) 20 MG tablet Take 20 mg by mouth daily as needed for indigestion or heartburn.     ketotifen (ZADITOR) 0.035 % ophthalmic solution Place 1 drop into both eyes daily as needed (itchy watery eyes). 30 mL 1   Soap & Cleansers (NEUTROGENA ACNE CLEANSING SOAP) BAR Apply 1 Bar topically daily as needed.     UNABLE TO FIND Med Name: Allergy shot weekly     Vitamin D, Ergocalciferol, (DRISDOL) 50000 units CAPS capsule Take 1 capsule (50,000 Units total) by mouth 2  (two) times a week. 24 capsule 0   albuterol (VENTOLIN HFA) 108 (90 Base) MCG/ACT inhaler Inhale 1-2 puffs into the lungs every 6 (six) hours as needed for wheezing or shortness of breath. 18 g 1   azelastine (ASTELIN) 0.1 % nasal spray Place 1 spray into both nostrils 2 (two) times daily. Use in each nostril as directed 30 mL 5   cetirizine (ZYRTEC ALLERGY) 10 MG tablet Take 1 tablet (10 mg total) by mouth daily as needed for allergies (Can take and extra dose during flare ups). 180 tablet 1   fluticasone (FLONASE) 50 MCG/ACT nasal spray Place 2 sprays into both nostrils daily. 48 g 1   hydrocortisone 2.5 % cream Apply topically 2 (two) times daily. Apply twice daily for flare ups above neck, maximum 7 days. 30 g 5   tacrolimus (PROTOPIC) 0.1 % ointment Apply two times daily and wean as skin clears. 100 g 5   triamcinolone ointment (KENALOG) 0.1 % Apply twice daily for flare ups below neck, maximum  10 days. 454 g 1   No current facility-administered medications for this visit.   Allergies: Allergies  Allergen Reactions   Meloxicam Nausea And Vomiting   Omeprazole Palpitations    Muscle weakness and jittery feeling   Azithromycin Diarrhea, Nausea Only and Nausea And Vomiting   I reviewed his past medical history, social history, family history, and environmental history and no significant changes have been reported from previous visits.  Review of Systems  Objective:  Physical exam not obtained as encounter was done via telephone.   Previous notes and tests were reviewed.  I discussed the assessment and treatment plan with the patient. The patient was provided an opportunity to ask questions and all were answered. The patient agreed with the plan and demonstrated an understanding of the instructions.   The patient was advised to call back or seek an in-person evaluation if the symptoms worsen or if the condition fails to improve as anticipated.  I provided 17 minutes of  non-face-to-face time during this encounter.  Alesia Morin, MD [facility]

## 2023-01-26 NOTE — Patient Instructions (Addendum)
Allergic Rhinitis: - Positive skin test 10/2022: trees, grasses, weeds, molds, dust mite, cat, cockroach - Avoidance measures discussed. - Use nasal saline rinses before nose sprays such as with Neilmed Sinus Rinse.  Use distilled water.   - Use Flonase 2 sprays each nostril daily. Aim upward and outward. - Use Azelastine 1-2 sprays each nostril twice daily as needed. Aim upward and outward. - Use Zyrtec 10 mg daily.  - For eyes, use Olopatadine or Ketotifen 1 eye drop daily as needed for itchy, watery eyes.  Available over the counter, if not covered by insurance.  - Continue allergy shots.  Bring Epipen at each visit.  Discussed getting back on track with this and coming once weekly to help build up on time.   Asthma: - Maintenance inhaler: none - Rescue inhaler: Albuterol 2 puffs via spacer or 1 vial via nebulizer every 4-6 hours as needed for respiratory symptoms of cough, shortness of breath, or wheezing Asthma control goals:  Full participation in all desired activities (may need albuterol before activity) Albuterol use two times or less a week on average (not counting use with activity) Cough interfering with sleep two times or less a month Oral steroids no more than once a year No hospitalizations   Eczema: - Do a daily soaking tub bath in warm water for 10-15 minutes.  - Use a gentle, unscented cleanser at the end of the bath (such as Dove unscented bar or baby wash, or Aveeno sensitive body wash). Then rinse, pat half-way dry, and apply a gentle, unscented moisturizer cream or ointment (Cerave, Cetaphil, Eucerin, Aveeno)  all over while still damp. Dry skin makes the itching and rash of eczema worse. The skin should be moisturized with a gentle, unscented moisturizer at least twice daily.  - Use only unscented liquid laundry detergent. - Apply prescribed topical steroid (triamcinolone 0.1% below neck or hydrocortisone 2.5% above neck) to flared areas (red and thickened eczema) after  the moisturizer has soaked into the skin (wait at least 30 minutes). Taper off the topical steroids as the skin improves. Do not use topical steroid for more than 7-10 days at a time.  - Put Protopic onto areas of rough eczema (that is not red) twice a day. May decrease to once a day as the eczema improves. This will not thin the skin, and is safe for chronic use. Do not put this onto normal appearing skin. Okay to use on face.

## 2023-01-30 ENCOUNTER — Ambulatory Visit (INDEPENDENT_AMBULATORY_CARE_PROVIDER_SITE_OTHER): Payer: No Typology Code available for payment source

## 2023-01-30 DIAGNOSIS — J309 Allergic rhinitis, unspecified: Secondary | ICD-10-CM | POA: Diagnosis not present

## 2023-02-06 ENCOUNTER — Ambulatory Visit (INDEPENDENT_AMBULATORY_CARE_PROVIDER_SITE_OTHER): Payer: No Typology Code available for payment source

## 2023-02-06 DIAGNOSIS — J309 Allergic rhinitis, unspecified: Secondary | ICD-10-CM

## 2023-02-13 ENCOUNTER — Ambulatory Visit (INDEPENDENT_AMBULATORY_CARE_PROVIDER_SITE_OTHER): Payer: No Typology Code available for payment source

## 2023-02-13 DIAGNOSIS — J309 Allergic rhinitis, unspecified: Secondary | ICD-10-CM

## 2023-03-04 ENCOUNTER — Ambulatory Visit (INDEPENDENT_AMBULATORY_CARE_PROVIDER_SITE_OTHER): Payer: No Typology Code available for payment source

## 2023-03-04 DIAGNOSIS — J309 Allergic rhinitis, unspecified: Secondary | ICD-10-CM

## 2023-03-18 ENCOUNTER — Ambulatory Visit (INDEPENDENT_AMBULATORY_CARE_PROVIDER_SITE_OTHER): Payer: No Typology Code available for payment source

## 2023-03-18 DIAGNOSIS — J309 Allergic rhinitis, unspecified: Secondary | ICD-10-CM

## 2023-03-27 ENCOUNTER — Ambulatory Visit (INDEPENDENT_AMBULATORY_CARE_PROVIDER_SITE_OTHER): Payer: No Typology Code available for payment source

## 2023-03-27 DIAGNOSIS — J309 Allergic rhinitis, unspecified: Secondary | ICD-10-CM

## 2023-04-03 ENCOUNTER — Ambulatory Visit (INDEPENDENT_AMBULATORY_CARE_PROVIDER_SITE_OTHER): Payer: No Typology Code available for payment source

## 2023-04-03 DIAGNOSIS — J309 Allergic rhinitis, unspecified: Secondary | ICD-10-CM | POA: Diagnosis not present

## 2023-05-01 ENCOUNTER — Ambulatory Visit (INDEPENDENT_AMBULATORY_CARE_PROVIDER_SITE_OTHER): Payer: No Typology Code available for payment source

## 2023-05-01 DIAGNOSIS — J309 Allergic rhinitis, unspecified: Secondary | ICD-10-CM | POA: Diagnosis not present

## 2023-05-08 ENCOUNTER — Ambulatory Visit (INDEPENDENT_AMBULATORY_CARE_PROVIDER_SITE_OTHER): Payer: No Typology Code available for payment source

## 2023-05-08 DIAGNOSIS — J309 Allergic rhinitis, unspecified: Secondary | ICD-10-CM | POA: Diagnosis not present

## 2023-05-15 ENCOUNTER — Ambulatory Visit (INDEPENDENT_AMBULATORY_CARE_PROVIDER_SITE_OTHER): Payer: No Typology Code available for payment source

## 2023-05-15 DIAGNOSIS — J309 Allergic rhinitis, unspecified: Secondary | ICD-10-CM | POA: Diagnosis not present

## 2023-05-22 ENCOUNTER — Ambulatory Visit (INDEPENDENT_AMBULATORY_CARE_PROVIDER_SITE_OTHER): Payer: No Typology Code available for payment source

## 2023-05-22 DIAGNOSIS — J309 Allergic rhinitis, unspecified: Secondary | ICD-10-CM

## 2023-05-29 ENCOUNTER — Ambulatory Visit (INDEPENDENT_AMBULATORY_CARE_PROVIDER_SITE_OTHER): Payer: No Typology Code available for payment source

## 2023-05-29 DIAGNOSIS — J309 Allergic rhinitis, unspecified: Secondary | ICD-10-CM | POA: Diagnosis not present

## 2023-06-05 ENCOUNTER — Ambulatory Visit (INDEPENDENT_AMBULATORY_CARE_PROVIDER_SITE_OTHER): Payer: No Typology Code available for payment source

## 2023-06-05 DIAGNOSIS — J309 Allergic rhinitis, unspecified: Secondary | ICD-10-CM

## 2023-06-26 ENCOUNTER — Ambulatory Visit (INDEPENDENT_AMBULATORY_CARE_PROVIDER_SITE_OTHER): Payer: No Typology Code available for payment source

## 2023-06-26 DIAGNOSIS — J309 Allergic rhinitis, unspecified: Secondary | ICD-10-CM | POA: Diagnosis not present

## 2023-07-06 ENCOUNTER — Ambulatory Visit: Payer: No Typology Code available for payment source | Admitting: Internal Medicine

## 2023-07-10 ENCOUNTER — Ambulatory Visit (INDEPENDENT_AMBULATORY_CARE_PROVIDER_SITE_OTHER): Payer: No Typology Code available for payment source

## 2023-07-10 DIAGNOSIS — J309 Allergic rhinitis, unspecified: Secondary | ICD-10-CM | POA: Diagnosis not present

## 2023-07-10 MED ORDER — EPINEPHRINE 0.3 MG/0.3ML IJ SOAJ: INTRAMUSCULAR | 2 refills | Status: AC

## 2023-07-24 ENCOUNTER — Ambulatory Visit (INDEPENDENT_AMBULATORY_CARE_PROVIDER_SITE_OTHER): Payer: No Typology Code available for payment source

## 2023-07-24 DIAGNOSIS — J309 Allergic rhinitis, unspecified: Secondary | ICD-10-CM

## 2023-09-01 NOTE — Patient Instructions (Signed)
 Asthma Continue albuterol  2 puffs once every 4 hours if needed for cough or wheeze You may use albuterol  2 puffs 5 to 15 minutes before activity to decrease cough or wheeze  Allergic rhinitis Continue allergen immunotherapy directed toward pollen, mold, dust mite, cat, and cockroach and have access to an epinephrine  autoinjector set per protocol  Atopic dermatitis - Do a daily soaking tub bath in warm water for 10-15 minutes.  - Use a gentle, unscented cleanser at the end of the bath (such as Dove unscented bar or baby wash, or Aveeno sensitive body wash). Then rinse, pat half-way dry, and apply a gentle, unscented moisturizer cream or ointment (Cerave, Cetaphil, Eucerin, Aveeno)  all over while still damp. Dry skin makes the itching and rash of eczema worse. The skin should be moisturized with a gentle, unscented moisturizer at least twice daily.  - Use only unscented liquid laundry detergent. - Apply prescribed topical steroid (triamcinolone  0.1% below neck or hydrocortisone  2.5% above neck) to flared areas (red and thickened eczema) after the moisturizer has soaked into the skin (wait at least 30 minutes). Taper off the topical steroids as the skin improves. Do not use topical steroid for more than 7-10 days at a time.  - Put Protopic  onto areas of rough eczema (that is not red) twice a day. May decrease to once a day as the eczema improves. This will not thin the skin, and is safe for chronic use. Do not put this onto normal appearing skin. Okay to use on face.   Call the clinic if this treatment plan is not working well for you  Follow up in 1 year or sooner if needed.   Reducing Pollen Exposure The American Academy of All[employer] , Asthma and [employer] suggests the following steps to reduce your exposure to pollen during allergy  seasons. Do not hang sheets or clothing out to dry; pollen may collect on these items. Do not mow lawns or spend time around freshly cut grass; mowing stirs up  pollen. Keep windows closed at night.  Keep car windows closed while driving. Minimize morning activities outdoors, a time when pollen counts are usually at their highest. Stay indoors as much as possible when pollen counts or humidity is high and on windy days when pollen tends to remain in the air longer. Use air conditioning when possible.  Many air conditioners have filters that trap the pollen spores. Use a HEPA room air filter to remove pollen form the indoor air you breathe.  Control of Mold Allergen Mold and fungi can grow on a variety of surfaces provided certain temperature and moisture conditions exist.  Outdoor molds grow on plants, decaying vegetation and soil.  The major outdoor mold, Alternaria and Cladosporium, are found in very high numbers during hot and dry conditions.  Generally, a late Summer - Fall peak is seen for common outdoor fungal spores.  Rain will temporarily lower outdoor mold spore count, but counts rise rapidly when the rainy period ends.  The most important indoor molds are Aspergillus and Penicillium.  Dark, humid and poorly ventilated basements are ideal sites for mold growth.  The next most common sites of mold growth are the bathroom and the kitchen.  Outdoor [employer] Use air conditioning and keep windows closed Avoid exposure to decaying vegetation. Avoid leaf raking. Avoid grain handling. Consider wearing a face mask if working in moldy areas.  Indoor Mold Control Maintain humidity below 50%. Clean washable surfaces with 5% bleach solution. Remove sources e.g. Contaminated  carpets.   Control of Dust Mite Allergen Dust mites play a major role in allergic asthma and rhinitis. They occur in environments with high humidity wherever human skin is found. Dust mites absorb humidity from the atmosphere (ie, they do not drink) and feed on organic matter (including shed human and animal skin). Dust mites are a microscopic type of insect that you cannot see  with the naked eye. High levels of dust mites have been detected from mattresses, pillows, carpets, upholstered furniture, bed covers, clothes, soft toys and any woven material. The principal allergen of the dust mite is found in its feces. A gram of dust may contain 1,000 mites and 250,000 fecal particles. Mite antigen is easily measured in the air during house cleaning activities. Dust mites do not bite and do not cause harm to humans, other than by triggering allergies/asthma.  Ways to decrease your exposure to dust mites in your home:  1. Encase mattresses, box springs and pillows with a mite-impermeable barrier or cover  2. Wash sheets, blankets and drapes weekly in hot water (130 F) with detergent and dry them in a dryer on the hot setting.  3. Have the room cleaned frequently with a vacuum cleaner and a damp dust-mop. For carpeting or rugs, vacuuming with a vacuum cleaner equipped with a high-efficiency particulate air (HEPA) filter. The dust mite allergic individual should not be in a room which is being cleaned and should wait 1 hour after cleaning before going into the room.  4. Do not sleep on upholstered furniture (eg, couches).  5. If possible removing carpeting, upholstered furniture and drapery from the home is ideal. Horizontal blinds should be eliminated in the rooms where the person spends the most time (bedroom, study, television room). Washable vinyl, roller-type shades are optimal.  6. Remove all non-washable stuffed toys from the bedroom. Wash stuffed toys weekly like sheets and blankets above.  7. Reduce indoor humidity to less than 50%. Inexpensive humidity monitors can be purchased at most hardware stores. Do not use a humidifier as can make the problem worse and are not recommended.  Control of Dog or Cat Allergen Avoidance is the best way to manage a dog or cat allergy . If you have a dog or cat and are allergic to dog or cats, consider removing the dog or cat from the  home. If you have a dog or cat but don't want to find it a new home, or if your family wants a pet even though someone in the household is allergic, here are some strategies that may help keep symptoms at bay:  Keep the pet out of your bedroom and restrict it to only a few rooms. Be advised that keeping the dog or cat in only one room will not limit the allergens to that room. Don't pet, hug or kiss the dog or cat; if you do, wash your hands with soap and water. High-efficiency particulate air (HEPA) cleaners run continuously in a bedroom or living room can reduce allergen levels over time. Regular use of a high-efficiency vacuum cleaner or a central vacuum can reduce allergen levels. Giving your dog or cat a bath at least once a week can reduce airborne allergen.  Control of Cockroach Allergen Cockroach allergen has been identified as an important cause of acute attacks of asthma, especially in urban settings.  There are fifty-five species of cockroach that exist in the [place] , however only three, the [place], [place] and [place] species produce allergen that can affect  patients with Asthma.  Allergens can be obtained from fecal particles, egg casings and secretions from cockroaches.    Remove food sources. Reduce access to water. Seal access and entry points. Spray runways with 0.5-1% Diazinon or Chlorpyrifos Blow boric acid power under stoves and refrigerator. Place bait stations (hydramethylnon) at feeding sites.

## 2023-09-01 NOTE — Progress Notes (Signed)
 [address] [place]  [place] [address] Dept: [phone]  FOLLOW UP NOTE  Patient ID: James Bowers, male    DOB: 1986/02/18  Age: 37 y.o. MRN: [id number] Date of Office Visit: 09/02/2023  Assessment  Chief Complaint: Follow-up  HPI James Bowers is a 38 year old male who presents to the clinic for follow-up visit.  He was last seen in this clinic on 01/26/2023 by Dr. Lydia Sams via televisit for evaluation of asthma, allergic rhinitis on allergen immunotherapy, and atopic dermatitis.  He began allergen immunotherapy directed toward pollen, mold, dust mite, cat, and cockroach on 11/21/2022.  At today's visit, he reports that his asthma has been well controlled with no shortness of breath, cough or wheeze. He reports that he did have an illness about 2 weeks ago for which he used his albuterol  with relief of symptoms. He reports that he uses albuterol  about 2 times a year.   Allergic rhinitis is reported as well controlled with no rhinorrhea, nasal congestion, sneezing, or post nasal drainage. He continues cetirizine  10 mg once a day and uses Flonase  and azelastine  as needed with relief of symptoms. He is not currently using any nasal saline rinses. He continues allergen immunotherapy directed toward pollens, mold, cat, and cockroach with no large or local reactions. He began allergen immunotherapy on 11/21/2022.   Atopic dermatis is reported as well controlled with rare rad and itchy areas mostly occurring in the bilateral antecubital fosse for which he continues a daily moisturizing routine and rarely uses triamcinolone , hydrocortisone , or Protopic  for relief of symptoms.   His current medications are listed in the chart.  Drug Allergies:  Allergies  Allergen Reactions   Meloxicam Nausea And Vomiting   Omeprazole  Palpitations    Muscle weakness and jittery feeling   Azithromycin Diarrhea, Nausea Only and Nausea And Vomiting    Physical Exam: BP 122/82   Pulse 82   Temp 98.1 F  (36.7 C)   Resp 18   Ht 6' 0.44" (1.84 m)   Wt 273 lb 2 oz (123.9 kg)   SpO2 95%   BMI 36.59 kg/m    Physical Exam Vitals reviewed.  Constitutional:      Appearance: Normal appearance.  HENT:     Head: Normocephalic and atraumatic.     Right Ear: Tympanic membrane normal.     Left Ear: Tympanic membrane normal.     Nose:     Comments: Bilateral naris slightly erythematous with thin clear nasal drainage noted.  Pharynx normal.  Ears normal.  Eyes normal.    Mouth/Throat:     Pharynx: Oropharynx is clear.  Eyes:     Conjunctiva/sclera: Conjunctivae normal.  Cardiovascular:     Rate and Rhythm: Normal rate and regular rhythm.     Heart sounds: Normal heart sounds. No murmur heard. Pulmonary:     Effort: Pulmonary effort is normal.     Breath sounds: Normal breath sounds.     Comments: Lungs clear to auscultation Musculoskeletal:        General: Normal range of motion.     Cervical back: Normal range of motion and neck supple.  Skin:    General: Skin is warm and dry.  Neurological:     Mental Status: He is alert and oriented to person, place, and time.  Psychiatric:        Mood and Affect: Mood normal.        Behavior: Behavior normal.        Thought Content: Thought content  normal.        Judgment: Judgment normal.     Assessment and Plan: 1. Mild intermittent asthma without complication   2. Seasonal and perennial allergic rhinitis   3. Flexural atopic dermatitis     Patient Instructions  Asthma Continue albuterol  2 puffs once every 4 hours if needed for cough or wheeze You may use albuterol  2 puffs 5 to 15 minutes before activity to decrease cough or wheeze  Allergic rhinitis Continue allergen immunotherapy directed toward pollen, mold, dust mite, cat, and cockroach and have access to an epinephrine  autoinjector set per protocol  Atopic dermatitis - Do a daily soaking tub bath in warm water for 10-15 minutes.  - Use a gentle, unscented cleanser at the end  of the bath (such as Dove unscented bar or baby wash, or Aveeno sensitive body wash). Then rinse, pat half-way dry, and apply a gentle, unscented moisturizer cream or ointment (Cerave, Cetaphil, Eucerin, Aveeno)  all over while still damp. Dry skin makes the itching and rash of eczema worse. The skin should be moisturized with a gentle, unscented moisturizer at least twice daily.  - Use only unscented liquid laundry detergent. - Apply prescribed topical steroid (triamcinolone  0.1% below neck or hydrocortisone  2.5% above neck) to flared areas (red and thickened eczema) after the moisturizer has soaked into the skin (wait at least 30 minutes). Taper off the topical steroids as the skin improves. Do not use topical steroid for more than 7-10 days at a time.  - Put Protopic  onto areas of rough eczema (that is not red) twice a day. May decrease to once a day as the eczema improves. This will not thin the skin, and is safe for chronic use. Do not put this onto normal appearing skin. Okay to use on face.   Call the clinic if this treatment plan is not working well for you  Follow up in 1 year or sooner if needed.    Return in about 1 year (around 09/01/2024), or if symptoms worsen or fail to improve.    Thank you for the opportunity to care for this patient.  Please do not hesitate to contact me with questions.  Marinus Sic, FNP [facility] 

## 2023-09-02 ENCOUNTER — Ambulatory Visit (INDEPENDENT_AMBULATORY_CARE_PROVIDER_SITE_OTHER): Payer: No Typology Code available for payment source | Admitting: Family Medicine

## 2023-09-02 ENCOUNTER — Encounter: Payer: Self-pay | Admitting: Family Medicine

## 2023-09-02 VITALS — BP 122/82 | HR 82 | Temp 98.1°F | Resp 18 | Ht 72.44 in | Wt 273.1 lb

## 2023-09-02 DIAGNOSIS — L2089 Other atopic dermatitis: Secondary | ICD-10-CM | POA: Diagnosis not present

## 2023-09-02 DIAGNOSIS — J4520 Mild intermittent asthma, uncomplicated: Secondary | ICD-10-CM | POA: Insufficient documentation

## 2023-09-02 DIAGNOSIS — J3089 Other allergic rhinitis: Secondary | ICD-10-CM | POA: Diagnosis not present

## 2023-09-02 DIAGNOSIS — J302 Other seasonal allergic rhinitis: Secondary | ICD-10-CM | POA: Insufficient documentation

## 2023-10-23 ENCOUNTER — Telehealth: Payer: Self-pay | Admitting: Internal Medicine

## 2023-10-23 NOTE — Telephone Encounter (Signed)
 LVM for pt and stated that his [facility] authorization expires on 10/27/2023 and advised him to call VA and get a new authorization.

## 2024-03-02 ENCOUNTER — Ambulatory Visit: Payer: No Typology Code available for payment source | Admitting: Allergy & Immunology
